# Patient Record
Sex: Male | Born: 1965 | Race: White | Hispanic: No | Marital: Single | Smoking: Light tobacco smoker
Health system: Southern US, Community
[De-identification: ages and names within clinical notes are randomized; demographics above are authoritative.]

## PROBLEM LIST (undated history)

## (undated) DIAGNOSIS — I4891 Unspecified atrial fibrillation: Secondary | ICD-10-CM

## (undated) DIAGNOSIS — I639 Cerebral infarction, unspecified: Secondary | ICD-10-CM

## (undated) DIAGNOSIS — G459 Transient cerebral ischemic attack, unspecified: Secondary | ICD-10-CM

## (undated) DIAGNOSIS — E119 Type 2 diabetes mellitus without complications: Secondary | ICD-10-CM

## (undated) DIAGNOSIS — M4807 Spinal stenosis, lumbosacral region: Secondary | ICD-10-CM

## (undated) DIAGNOSIS — I471 Supraventricular tachycardia, unspecified: Secondary | ICD-10-CM

## (undated) HISTORY — PX: UVULOPALATOPHARYNGOPLASTY: SHX827

## (undated) HISTORY — DX: Supraventricular tachycardia: I47.1

## (undated) HISTORY — PX: OTHER SURGICAL HISTORY: SHX169

## (undated) HISTORY — PX: CARDIAC CATHETERIZATION: SHX172

## (undated) HISTORY — DX: Supraventricular tachycardia, unspecified: I47.10

---

## 2014-08-31 ENCOUNTER — Other Ambulatory Visit: Payer: Self-pay

## 2014-08-31 ENCOUNTER — Emergency Department (HOSPITAL_BASED_OUTPATIENT_CLINIC_OR_DEPARTMENT_OTHER)
Admission: EM | Admit: 2014-08-31 | Discharge: 2014-09-01 | Disposition: A | Discharge status: Home / Self Care | Payer: Self-pay | Attending: Emergency Medicine | Admitting: Emergency Medicine

## 2014-08-31 ENCOUNTER — Encounter (HOSPITAL_BASED_OUTPATIENT_CLINIC_OR_DEPARTMENT_OTHER): Payer: Self-pay | Admitting: Emergency Medicine

## 2014-08-31 DIAGNOSIS — W01198A Fall on same level from slipping, tripping and stumbling with subsequent striking against other object, initial encounter: Secondary | ICD-10-CM | POA: Insufficient documentation

## 2014-08-31 DIAGNOSIS — R1912 Hyperactive bowel sounds: Secondary | ICD-10-CM | POA: Insufficient documentation

## 2014-08-31 DIAGNOSIS — Z9889 Other specified postprocedural states: Secondary | ICD-10-CM | POA: Insufficient documentation

## 2014-08-31 DIAGNOSIS — Z23 Encounter for immunization: Secondary | ICD-10-CM | POA: Insufficient documentation

## 2014-08-31 DIAGNOSIS — Y9289 Other specified places as the place of occurrence of the external cause: Secondary | ICD-10-CM | POA: Insufficient documentation

## 2014-08-31 DIAGNOSIS — S0081XA Abrasion of other part of head, initial encounter: Secondary | ICD-10-CM | POA: Insufficient documentation

## 2014-08-31 DIAGNOSIS — G43909 Migraine, unspecified, not intractable, without status migrainosus: Secondary | ICD-10-CM | POA: Insufficient documentation

## 2014-08-31 DIAGNOSIS — T148XXA Other injury of unspecified body region, initial encounter: Secondary | ICD-10-CM

## 2014-08-31 DIAGNOSIS — Z7901 Long term (current) use of anticoagulants: Secondary | ICD-10-CM | POA: Insufficient documentation

## 2014-08-31 DIAGNOSIS — Z8673 Personal history of transient ischemic attack (TIA), and cerebral infarction without residual deficits: Secondary | ICD-10-CM | POA: Insufficient documentation

## 2014-08-31 DIAGNOSIS — Z8739 Personal history of other diseases of the musculoskeletal system and connective tissue: Secondary | ICD-10-CM | POA: Insufficient documentation

## 2014-08-31 DIAGNOSIS — T148 Other injury of unspecified body region: Secondary | ICD-10-CM | POA: Insufficient documentation

## 2014-08-31 DIAGNOSIS — Z8679 Personal history of other diseases of the circulatory system: Secondary | ICD-10-CM | POA: Insufficient documentation

## 2014-08-31 DIAGNOSIS — Y998 Other external cause status: Secondary | ICD-10-CM | POA: Insufficient documentation

## 2014-08-31 DIAGNOSIS — W108XXA Fall (on) (from) other stairs and steps, initial encounter: Secondary | ICD-10-CM | POA: Insufficient documentation

## 2014-08-31 DIAGNOSIS — Z720 Tobacco use: Secondary | ICD-10-CM | POA: Insufficient documentation

## 2014-08-31 DIAGNOSIS — E1165 Type 2 diabetes mellitus with hyperglycemia: Secondary | ICD-10-CM | POA: Insufficient documentation

## 2014-08-31 DIAGNOSIS — W19XXXA Unspecified fall, initial encounter: Secondary | ICD-10-CM

## 2014-08-31 DIAGNOSIS — Y9389 Activity, other specified: Secondary | ICD-10-CM | POA: Insufficient documentation

## 2014-08-31 HISTORY — DX: Type 2 diabetes mellitus without complications: E11.9

## 2014-08-31 HISTORY — DX: Transient cerebral ischemic attack, unspecified: G45.9

## 2014-08-31 HISTORY — DX: Cerebral infarction, unspecified: I63.9

## 2014-08-31 HISTORY — DX: Spinal stenosis, lumbosacral region: M48.07

## 2014-08-31 HISTORY — DX: Unspecified atrial fibrillation: I48.91

## 2014-08-31 LAB — CBG MONITORING, ED: Glucose-Capillary: 395 mg/dL — ABNORMAL HIGH (ref 65–99)

## 2014-08-31 NOTE — ED Notes (Signed)
I placed patient on monitor. 

## 2014-08-31 NOTE — ED Notes (Addendum)
Patient states he has been having trouble with falls recently. Patient states last week he fell twice including once down a flight of stairs, states he fell again this evening striking his head, +LOC with vomiting. Patient states he has AFIB and DM, with high CBG at home. CBG 339 at home, @ 45 minutes ago, after passing out. Patient states he is off his diabetes and cardiac medications for @ 6 months due to loss of insurance. Was taking Metformin and Warforin

## 2014-09-01 ENCOUNTER — Emergency Department (HOSPITAL_BASED_OUTPATIENT_CLINIC_OR_DEPARTMENT_OTHER): Payer: Self-pay

## 2014-09-01 ENCOUNTER — Encounter (HOSPITAL_BASED_OUTPATIENT_CLINIC_OR_DEPARTMENT_OTHER): Payer: Self-pay | Admitting: Emergency Medicine

## 2014-09-01 LAB — BASIC METABOLIC PANEL
Anion gap: 8 (ref 5–15)
BUN: 16 mg/dL (ref 6–20)
CHLORIDE: 102 mmol/L (ref 101–111)
CO2: 25 mmol/L (ref 22–32)
CREATININE: 0.66 mg/dL (ref 0.61–1.24)
Calcium: 8.9 mg/dL (ref 8.9–10.3)
GFR calc Af Amer: >60 mL/min (ref >60)
GFR calc non Af Amer: >60 mL/min (ref >60)
Glucose, Bld: 293 mg/dL — ABNORMAL HIGH (ref 65–99)
Potassium: 3.8 mmol/L (ref 3.5–5.1)
Sodium: 135 mmol/L (ref 135–145)

## 2014-09-01 LAB — CBC WITH DIFFERENTIAL/PLATELET
BASOS ABS: 0 10*3/uL (ref 0.0–0.1)
Basophils Relative: 0 % (ref 0–1)
EOS PCT: 4 % (ref 0–5)
Eosinophils Absolute: 0.3 10*3/uL (ref 0.0–0.7)
HCT: 41.1 % (ref 39.0–52.0)
HEMOGLOBIN: 14.8 g/dL (ref 13.0–17.0)
LYMPHS ABS: 2.7 10*3/uL (ref 0.7–4.0)
Lymphocytes Relative: 41 % (ref 12–46)
MCH: 32 pg (ref 26.0–34.0)
MCHC: 36 g/dL (ref 30.0–36.0)
MCV: 89 fL (ref 78.0–100.0)
MONOS PCT: 9 % (ref 3–12)
Monocytes Absolute: 0.6 10*3/uL (ref 0.1–1.0)
Neutro Abs: 3 10*3/uL (ref 1.7–7.7)
Neutrophils Relative %: 46 % (ref 43–77)
PLATELETS: 139 10*3/uL — AB (ref 150–400)
RBC: 4.62 MIL/uL (ref 4.22–5.81)
RDW: 12 % (ref 11.5–15.5)
WBC: 6.5 10*3/uL (ref 4.0–10.5)

## 2014-09-01 LAB — TROPONIN I

## 2014-09-01 MED ORDER — ONDANSETRON HCL 4 MG/2ML IJ SOLN
4.0000 mg | Freq: Once | INTRAMUSCULAR | Status: AC
  Administered 2014-09-01: 4 mg via INTRAVENOUS

## 2014-09-01 MED ORDER — KETOROLAC TROMETHAMINE 30 MG/ML IJ SOLN
30.0000 mg | Freq: Once | INTRAMUSCULAR | Status: AC
  Administered 2014-09-01: 30 mg via INTRAVENOUS
  Filled 2014-09-01: qty 1

## 2014-09-01 MED ORDER — TETANUS-DIPHTH-ACELL PERTUSSIS 5-2.5-18.5 LF-MCG/0.5 IM SUSP
0.5000 mL | Freq: Once | INTRAMUSCULAR | Status: AC
  Administered 2014-09-01: 0.5 mL via INTRAMUSCULAR
  Filled 2014-09-01: qty 0.5

## 2014-09-01 MED ORDER — METFORMIN HCL 500 MG PO TABS
500.0000 mg | ORAL_TABLET | Freq: Once | ORAL | Status: AC
  Administered 2014-09-01: 500 mg via ORAL
  Filled 2014-09-01: qty 1

## 2014-09-01 MED ORDER — METFORMIN HCL 500 MG PO TABS: 500.0000 mg | ORAL_TABLET | Freq: Two times a day (BID) | ORAL | Status: DC

## 2014-09-01 MED ORDER — TRAMADOL HCL 50 MG PO TABS: 50.0000 mg | ORAL_TABLET | Freq: Four times a day (QID) | ORAL | Status: DC | PRN

## 2014-09-01 MED ORDER — TRAMADOL HCL 50 MG PO TABS
50.0000 mg | ORAL_TABLET | Freq: Once | ORAL | Status: AC
  Administered 2014-09-01: 50 mg via ORAL
  Filled 2014-09-01: qty 1

## 2014-09-01 MED ORDER — METHOCARBAMOL 500 MG PO TABS
1000.0000 mg | ORAL_TABLET | Freq: Once | ORAL | Status: AC
  Administered 2014-09-01: 1000 mg via ORAL
  Filled 2014-09-01: qty 2

## 2014-09-01 MED ORDER — ONDANSETRON HCL 4 MG/2ML IJ SOLN
INTRAMUSCULAR | Status: AC
  Filled 2014-09-01: qty 2

## 2014-09-01 NOTE — Discharge Instructions (Signed)
Blood Glucose Monitoring °Monitoring your blood glucose (also know as blood sugar) helps you to manage your diabetes. It also helps you and your health care provider monitor your diabetes and determine how well your treatment plan is working. °WHY SHOULD YOU MONITOR YOUR BLOOD GLUCOSE? °· It can help you understand how food, exercise, and medicine affect your blood glucose. °· It allows you to know what your blood glucose is at any given moment. You can quickly tell if you are having low blood glucose (hypoglycemia) or high blood glucose (hyperglycemia). °· It can help you and your health care provider know how to adjust your medicines. °· It can help you understand how to manage an illness or adjust medicine for exercise. °WHEN SHOULD YOU TEST? °Your health care provider will help you decide how often you should check your blood glucose. This may depend on the type of diabetes you have, your diabetes control, or the types of medicines you are taking. Be sure to write down all of your blood glucose readings so that this information can be reviewed with your health care provider. See below for examples of testing times that your health care provider may suggest. °Type 1 Diabetes °· Test 4 times a day if you are in good control, using an insulin pump, or perform multiple daily injections. °· If your diabetes is not well controlled or if you are sick, you may need to monitor more often. °· It is a good idea to also monitor: °· Before and after exercise. °· Between meals and 2 hours after a meal. °· Occasionally between 2:00 a.m. and 3:00 a.m. °Type 2 Diabetes °· It can vary with each person, but generally, if you are on insulin, test 4 times a day. °· If you take medicines by mouth (orally), test 2 times a day. °· If you are on a controlled diet, test once a day. °· If your diabetes is not well controlled or if you are sick, you may need to monitor more often. °HOW TO MONITOR YOUR BLOOD GLUCOSE °Supplies  Needed °· Blood glucose meter. °· Test strips for your meter. Each meter has its own strips. You must use the strips that go with your own meter. °· A pricking needle (lancet). °· A device that holds the lancet (lancing device). °· A journal or log book to write down your results. °Procedure °· Wash your hands with soap and water. Alcohol is not preferred. °· Prick the side of your finger (not the tip) with the lancet. °· Gently milk the finger until a small drop of blood appears. °· Follow the instructions that come with your meter for inserting the test strip, applying blood to the strip, and using your blood glucose meter. °Other Areas to Get Blood for Testing °Some meters allow you to use other areas of your body (other than your finger) to test your blood. These areas are called alternative sites. The most common alternative sites are: °· The forearm. °· The thigh. °· The back area of the lower leg. °· The palm of the hand. °The blood flow in these areas is slower. Therefore, the blood glucose values you get may be delayed, and the numbers are different from what you would get from your fingers. Do not use alternative sites if you think you are having hypoglycemia. Your reading will not be accurate. Always use a finger if you are having hypoglycemia. Also, if you cannot feel your lows (hypoglycemia unawareness), always use your fingers for your   blood glucose checks. ADDITIONAL TIPS FOR GLUCOSE MONITORING  Do not reuse lancets.  Always carry your supplies with you.  All blood glucose meters have a 24-hour "hotline" number to call if you have questions or need help.  Adjust (calibrate) your blood glucose meter with a control solution after finishing a few boxes of strips. BLOOD GLUCOSE RECORD KEEPING It is a good idea to keep a daily record or log of your blood glucose readings. Most glucose meters, if not all, keep your glucose records stored in the meter. Some meters come with the ability to download  your records to your home computer. Keeping a record of your blood glucose readings is especially helpful if you are wanting to look for patterns. Make notes to go along with the blood glucose readings because you might forget what happened at that exact time. Keeping good records helps you and your health care provider to work together to achieve good diabetes management.  Document Released: 03/24/2003 Document Revised: 08/05/2013 Document Reviewed: 08/13/2012 [facility] Patient Information 2015 [place], [place]. This information is not intended to replace advice given to you by your health care provider. Make sure you discuss any questions you have with your health care provider.  Contusion A contusion is the result of an injury to the skin and underlying tissues and is usually caused by direct trauma. The injury results in the appearance of a bruise on the skin overlying the injured tissues. Contusions cause rupture and bleeding of the small capillaries and blood vessels and affect function, because the bleeding infiltrates muscles, tendons, nerves, or other soft tissues.  SYMPTOMS   Swelling and often a hard lump in the injured area, either superficial or deep.  Pain and tenderness over the area of the contusion.  Feeling of firmness when pressure is exerted over the contusion.  Discoloration under the skin, beginning with redness and progressing to the characteristic "black and blue" bruise. CAUSES  A contusion is typically the result of direct trauma. This is often by a blunt object.  RISK INCREASES WITH:  Sports that have a high likelihood of trauma (football, boxing, ice hockey, soccer, field hockey, martial arts, basketball, and baseball).  Sports that make falling from a height likely (high-jumping, pole-vaulting, skating, or gymnastics).  Any bleeding disorder (hemophilia) or taking medications that affect clotting (aspirin, nonsteroidal anti-inflammatory medications, or warfarin  [Coumadin]).  Inadequate protection of exposed areas during contact sports. PREVENTION  Maintain physical fitness:  Joint and muscle flexibility.  Strength and endurance.  Coordination.  Wear proper protective equipment. Make sure it fits correctly. PROGNOSIS  Contusions typically heal without any complications. Healing time varies with the severity of injury and intake of medications that affect clotting. Contusions usually heal in 1 to 4 weeks. RELATED COMPLICATIONS   Damage to nearby nerves or blood vessels, causing numbness, coldness, or paleness.  Compartment syndrome.  Bleeding into the soft tissues that leads to disability.  Infiltrative-type bleeding, leading to the calcification and impaired function of the injured muscle (rare).  Prolonged healing time if usual activities are resumed too soon.  Infection if the skin over the injury site is broken.  Fracture of the bone underlying the contusion.  Stiffness in the joint where the injured muscle crosses. TREATMENT  Treatment initially consists of resting the injured area as well as medication and ice to reduce inflammation. The use of a compression bandage may also be helpful in minimizing inflammation. As pain diminishes and movement is tolerated, the joint where the affected  muscle crosses should be moved to prevent stiffness and the shortening (contracture) of the joint. Movement of the joint should begin as soon as possible. It is also important to work on maintaining strength within the affected muscles. Occasionally, extra padding over the area of contusion may be recommended before returning to sports, particularly if re-injury is likely.  MEDICATION   If pain relief is necessary these medications are often recommended:  Nonsteroidal anti-inflammatory medications, such as aspirin and ibuprofen.  Other minor pain relievers, such as acetaminophen, are often recommended.  Prescription pain relievers may be given  by your caregiver. Use only as directed and only as much as you need. HEAT AND COLD  Cold treatment (icing) relieves pain and reduces inflammation. Cold treatment should be applied for 10 to 15 minutes every 2 to 3 hours for inflammation and pain and immediately after any activity that aggravates your symptoms. Use ice packs or an ice massage. (To do an ice massage fill a large styrofoam cup with water and freeze. Tear a small amount of foam from the top so ice protrudes. Massage ice firmly over the injured area in a circle about the size of a softball.)  Heat treatment may be used prior to performing the stretching and strengthening activities prescribed by your caregiver, physical therapist, or athletic trainer. Use a heat pack or a warm soak. SEEK MEDICAL CARE IF:   Symptoms get worse or do not improve despite treatment in a few days.  You have difficulty moving a joint.  Any extremity becomes extremely painful, numb, pale, or cool (This is an emergency!).  Medication produces any side effects (bleeding, upset stomach, or allergic reaction).  Signs of infection (drainage from skin, headache, muscle aches, dizziness, fever, or general ill feeling) occur if skin was broken. Document Released: 03/21/2005 Document Revised: 06/13/2011 Document Reviewed: 07/03/2008 [facility] Patient Information 2015 [place], [place]. This information is not intended to replace advice given to you by your health care provider. Make sure you discuss any questions you have with your health care provider.  Emergency Department Resource Guide 1) Find a Doctor and Pay Out of Pocket Although you won't have to find out who is covered by your insurance plan, it is a good idea to ask around and get recommendations. You will then need to call the office and see if the doctor you have chosen will accept you as a new patient and what types of options they offer for patients who are self-pay. Some doctors offer discounts or will  set up payment plans for their patients who do not have insurance, but you will need to ask so you aren't surprised when you get to your appointment.  2) Contact Your Local Health Department Not all health departments have doctors that can see patients for sick visits, but many do, so it is worth a call to see if yours does. If you don't know where your local health department is, you can check in your phone book. The CDC also has a tool to help you locate your state's health department, and many state websites also have listings of all of their local health departments.  3) Find a Walk-in Clinic If your illness is not likely to be very severe or complicated, you may want to try a walk in clinic. These are popping up all over the country in pharmacies, drugstores, and shopping centers. They're usually staffed by nurse practitioners or physician assistants that have been trained to treat common illnesses and complaints. They're  usually fairly quick and inexpensive. However, if you have serious medical issues or chronic medical problems, these are probably not your best option.  No Primary Care Doctor: - Call Health Connect at  [phone] - they can help you locate a primary care doctor that  accepts your insurance, provides certain services, etc. - Physician Referral Service- [phone]  Chronic Pain Problems: Organization         Address  Phone   Notes  [facility]  [phone] Patients need to be referred by their primary care doctor.   Medication Assistance: Organization         Address  Phone   Notes  [facility] Medication [facility] [address] [place], [place] [address] [phone]) [phone] --Must be a resident of [facility] -- Must have NO insurance coverage whatsoever (no Medicaid/ Medicare, etc.) -- The pt. MUST have a primary care doctor that directs their care regularly and follows them in the community   MedAssist  [phone]) [phone]    [employer]  [phone]) [phone]    Agencies that provide inexpensive medical care: Organization         Address  Phone   Notes  [facility]  [phone]   [facility]    [phone]) [phone]   [facility] [address] [place], [place] [address] [phone]) [phone]   [facility] [address], [place] [phone]) [phone]   [facility]anned Parenthood    [phone](336) [phone]   [facility]    [phone]   [facility]  [address], [place] Phone:  [phone]) [phone], Fax:  [phone] Hours of Operation:  9 am - 6 pm, M-F.  Also accepts Medicaid/Medicare and self-pay.  [facility]  [address], [place] Phone: [phone]) [phone], Fax: [phone]. Hours of Operation:  8:30 am - 5:30 pm, M-F.  Also accepts Medicaid and self-pay.  [facility] [place] [address], [place] Phone: [phone]) [phone]   [facility] [address], [place], [place] [phone], Ext. [phone] Mondays & Thursdays: 7-9 AM.  First 15 patients are seen on a first come, first serve basis.    Medicaid-accepting [facility] Providers:  Organization         Address  Phone   Notes  [facility] [address], [place] [phone]) [phone] Also accepts self-pay patients.  [facility] [address], [place]  [phone](336) [phone]   [facility] [address], [place] [phone]) [phone]   [facility] [address] [place] [address], [place] [phone]) [phone]   Renaye RakersVeita Bland [address], [place]   [phone] Only accepts [place] Access [place] patients after they have their name applied to their card.   Self-Pay (no insurance) in [place] [facility] [place]:  Organization         Address  Phone   Notes  Sickle Cell Patients, [facility] [address],  [place] [phone]   [facility] [address], [place] [phone]) [phone]   [facility] [place]  [address], [place] [phone]   [facility]/Dr. Osei-Bonsu  [address] [place] [address], [place] or [id number], [place] [phone]  Phone number for both [employer] and [place] locations is the same.  [facility] [address], [place] [phone]   [facility] [address] [place] or [address] [phone]   [facility] [address] [place] [phone], phone; [phone], fax Sees patients 1st and 3rd Saturday of every month.  Must not qualify for public or private insurance (i.e. Medicaid, Medicare, [place] Health Choice, Veterans' Benefits)  Household income should be no more than 200% of the poverty level The clinic cannot treat you if you are pregnant or think you are pregnant  Sexually transmitted diseases are not treated at the clinic.    Dental Care: Organization         Address  Phone  Notes  [facility] [address] [place], [place] [phone] Accepts children up to age 73 who are enrolled in [place] or [place] Health Choice; pregnant women with a Medicaid card; and children who have applied for Medicaid or [place] Health Choice, but were declined, whose parents can pay a reduced fee at time of service.  [facility] Department of [facility]  [address], [place] [phone] Accepts children up to age 21 who are enrolled in [place] or [place] Health Choice; pregnant women with a Medicaid card; and children who have applied for Medicaid or [place] Health Choice, but were declined, whose parents can pay a reduced fee at time of service.  [facility]  [address] [place], [place] [phone] Patients are seen by appointment only. Walk-ins are not accepted. [facility] will see patients 63 years of age and older. Monday - Tuesday (8am-5pm) Most Wednesdays (8:30-5pm) $30 per visit, cash only  [facility] Adult Dental Access PROGRAM  [address], [facility] [phone] Patients are seen by appointment only. Walk-ins are not accepted. [facility] will see patients 23 years of age and older. One Wednesday Evening (Monthly: Volunteer Based).  $30 per visit, cash only  [employer]  [phone] for adults; Children under age 71, call Graduate Pediatric Dentistry at [phone]. Children aged 61-14, please call [phone] to request a pediatric application.  Dental services are provided in all areas of dental care including fillings, crowns and bridges, complete and partial dentures, implants, gum treatment, root canals, and extractions. Preventive care is also provided. Treatment is provided to both adults and children. Patients are selected via a lottery and there is often a waiting list.   [facility] [address] [place]  [phone] [email or web address]   [facility] [address] [place], [place] [phone], Ext. [phone] Second and Fourth Thursday of each month, opens at 6:30 AM; Clinic ends at 9 AM.  Patients are seen on a first-come first-served basis, and a limited number are seen during each clinic.   [facility]  [address] [place], [place] [phone]   Eligibility Requirements You must have lived in [place], [place], or [place] counties for at least the last three months.   You cannot be eligible for state or federal sponsored [place], including [employer], [place], or [employer].   You generally cannot be eligible for healthcare insurance through your employer.    How to apply: Eligibility screenings are held every Tuesday and Wednesday afternoon  from 1:00  pm until 4:00 pm. You do not need an appointment for the interview!  [facility] [address] [place], [place] [phone]   [facility] Health Department  [phone]   [facility] Health Department  [phone]   [facility] Health Department  [phone]    Behavioral Health Resources in the Community: Intensive Outpatient Programs Organization         Address  Phone  Notes  [facility] Services [address], [place], [place] [phone]   [facility] Outpatient [address], [place], [place] [phone]   [facility] [address] [place], [place]  [phone]   [facility] Mental Health [address]  [place], [place] [phone] or [phone]   Substance Abuse Resources Organization         Address  Phone  Notes  Alcohol and Drug Services  [phone]   [facility]  [phone]   The [place]  [phone]   Floydene Flock  [phone]   Residential & Outpatient Substance Abuse Program  [phone]   Psychological Services Organization         Address  Phone  Notes  [facility] Behavioral Health  [phone]   [facility] Services  [phone]   [facility] [address] [place] [phone] or [phone]    Mobile Crisis Teams Organization         Address  Phone  Notes  [facility], Mobile Crisis Care Unit  [phone]   [facility]  [address] [place], [place] [phone]   Doristine Locks [address] [place] [phone]    Self-Help/Support Groups Organization         Address  Phone             Notes  [facility] [place] - variety of support groups  [phone]- [phone] Call for more information  Narcotics Anonymous (NA), [facility] [address], [employer] [place]  2 meetings at this location   Nutritional therapist         Address  Phone  Notes  [facility] [address],    [place]  [phone]   [facility]  [address], [place] [address], [place], [place] [phone]   [facility] [address] [place], [place] [phone] Admissions: 8am-3pm M-F  [facility] [address],    [place], [place] [phone]   [facility] [address] [place], [place], [place] [phone]   [facility] [address],  [place], [place] [phone]   [facility] - Intensive Outpatient [address], [place], [place] [phone]   [facility] ([facility] [address]  [place], [place] [phone] or [phone]   [facility] ([facility]) [address] [place], [place] [phone] Accepts Medicaid  [facility] [place] [address],  [place] [phone] Substance Abuse/Addiction Treatment   [facility] Organization         Address  Phone  Notes  [facility]  [phone]   Angie Fava, PhD [address] Knack [place], [place]   [phone] or [phone]   [facility]al   [address] [place], [place] [phone]   [facility] [address] [place], [place] [phone] Insurance/Medicaid/sponsorship through [employer] and Families [address]  [address], [place] [phone] Therapy/tele-psych/case  [facility] [address]   [place], [place] [phone]    Dr. Lolly Mustache  [phone]   [facility]  [employer] [facility] Dept. 1) [address], [place] 2) [address] [place] 3)  [address], [place] [phone]  [phone]   [facility] Child Abuse Hotline (205)[phone] or 6308174611 (After Hours)

## 2014-09-01 NOTE — ED Provider Notes (Signed)
CSN: [id number]     Arrival date & time 08/31/14  2315 History  This chart was scribed for Laquilla Dault, MD by Ronney LionSuzanne Le, ED Scribe. This patient was seen in room MH06/MH06 and the patient's care was started at 12:26 AM.    Chief Complaint  Patient presents with  . Fall   Patient is a 49 y.o. male presenting with fall. The history is provided by the patient. No language interpreter was used.  Fall This is a recurrent problem. The current episode started 3 to 5 hours ago. The problem occurs rarely. The problem has not changed since onset.Associated symptoms include headaches. Pertinent negatives include no chest pain, no abdominal pain and no shortness of breath. Nothing aggravates the symptoms. Nothing relieves the symptoms. He has tried nothing for the symptoms. The treatment provided no relief.     HPI Comments: Timothy Deleon is a 49 y.o. male who presents to the Emergency Department complaining of falling twice over the past several weeks. He fell tonight and struck his head and lost consciousness post fall.  He checked his blood sugar, which have been high recently and was measured at 460 this morning. Patient has taken Excedrin Migraine for his headaches as he has migraine headache. He denies standing up quickly. Patient is a current smoker. He denies constipation or diarrhea, although he does note he had diarrhea 3 days ago which since resolved. No DOE no CP no SOB no leg pain or swelling.  No focal neuro deficits or changes in vision or speech.    He has not been taking DM medication lately due to insurance issues. He used to take 500 mg metformin 3 times a day, Januvia, and warfarin for A-fib. Patient is from [place] and lived in [place]. He had had a cardiac catheterization but no stents placed.   Patient just moved here 3 months ago and does not yet have a PCP.   Past Medical History  Diagnosis Date  . Diabetes mellitus without complication   . A-fib   . Stroke   . TIA (transient  ischemic attack)   . Stenosis of lumbosacral spine    Past Surgical History  Procedure Laterality Date  . Cardiac catheterization    . Cardiac cardioversion    . Uvulopalatopharyngoplasty     History reviewed. No pertinent family history. History  Substance Use Topics  . Smoking status: Current Every Day Smoker -- 0.50 packs/day    Types: Cigarettes  . Smokeless tobacco: Not on file  . Alcohol Use: Yes     Comment: social    Review of Systems  Constitutional: Negative for fever.  Respiratory: Negative for cough, shortness of breath and wheezing.   Cardiovascular: Negative for chest pain, palpitations and leg swelling.  Gastrointestinal: Negative for abdominal pain, diarrhea and constipation.  Neurological: Positive for headaches. Negative for tremors, seizures, facial asymmetry, speech difficulty, weakness and numbness.  All other systems reviewed and are negative.     Allergies  Xanax  Home Medications   Prior to Admission medications   Not on File   BP 145/97 mmHg  Pulse 92  Temp(Src) 98 F (36.7 C) (Oral)  Resp 18  Ht 6\' 4"  (1.93 m)  Wt 291 lb (131.997 kg)  BMI 35.44 kg/m2  SpO2 100% Physical Exam  Constitutional: He is oriented to person, place, and time. He appears well-developed and well-nourished. No distress.  HENT:  Head: Normocephalic. Head is without raccoon's eyes and without Battle's sign.    Right  Ear: No mastoid tenderness. No hemotympanum.  Left Ear: No mastoid tenderness. No hemotympanum.  Nose: No nasal septal hematoma.  Mouth/Throat: Oropharynx is clear and moist. No oropharyngeal exudate.  Eyes: Conjunctivae and EOM are normal. Pupils are equal, round, and reactive to light.  Neck: Normal range of motion. Neck supple. No tracheal deviation present.  Cardiovascular: Normal rate, regular rhythm, normal heart sounds and intact distal pulses.   Pulmonary/Chest: Effort normal and breath sounds normal. No respiratory distress. He has no  wheezes. He has no rales. He exhibits no tenderness.  Abdominal: Soft. He exhibits no mass. There is no tenderness. There is no rebound and no guarding.  Hyperactive bowel sounds.  Musculoskeletal: Normal range of motion. He exhibits no edema or tenderness.  Lymphadenopathy:    He has no cervical adenopathy.  Neurological: He is alert and oriented to person, place, and time. He has normal reflexes. He displays normal reflexes. No cranial nerve deficit. He exhibits normal muscle tone.  Normal gait.    Skin: Skin is warm and dry. He is not diaphoretic.  Psychiatric: He has a normal mood and affect. His behavior is normal.  Nursing note and vitals reviewed.   ED Course  Procedures (including critical care time)  DIAGNOSTIC STUDIES: Oxygen Saturation is 100% on RA, normal by my interpretation.    COORDINATION OF CARE: 12:32 AM - Discussed treatment plan with pt at bedside, and pt agreed to plan.   Labs Review Labs Reviewed  CBG MONITORING, ED - Abnormal; Notable for the following:    Glucose-Capillary 395 (*)    All other components within normal limits  CBG MONITORING, ED    Imaging Review No results found.   EKG Interpretation None      MDM   Final diagnoses:  None    EKG Interpretation  Date/Time:  Sunday Aug 31 2014 23:40:18 EDT Ventricular Rate:  58 PR Interval:  174 QRS Duration: 94 QT Interval:  398 QTC Calculation: 390 R Axis:   9 Text Interpretation:  Sinus bradycardia Confirmed by [facility]  MD, Kamdon Reisig ([id number]) on 09/01/2014 6:41:36 AM     Monitored in the ED without AFIB or PVCs or pauses.  No neuro deficits, intact cognition.  Has a h/o headaches and I suspect this headache tonight is a combination of the fall and his recurrent migraines.  No signs of CVA or lacunar disease on CT scan  No CP no SOB no DOE.  Patient stood up and then fell.  No swelling in the legs no recent log car trips or plane trips.  He is not in atrial fibrillation.  We have  restarted his metformin.  Will refer to a family doctor in the community and cardiologist for ongoing care.  Return for SOB DOE palpitations CP or any concerns.    I personally performed the services described in this documentation, which was scribed in my presence. The recorded information has been reviewed and is accurate.     Cy Blamer, MD 09/01/14 [phone]

## 2014-09-24 ENCOUNTER — Ambulatory Visit: Payer: Self-pay | Attending: Internal Medicine | Admitting: Internal Medicine

## 2014-09-24 ENCOUNTER — Encounter: Payer: Self-pay | Admitting: Internal Medicine

## 2014-09-24 VITALS — BP 123/82 | HR 67 | Temp 97.6°F | Resp 16 | Ht 76.0 in | Wt 287.0 lb

## 2014-09-24 DIAGNOSIS — Z8673 Personal history of transient ischemic attack (TIA), and cerebral infarction without residual deficits: Secondary | ICD-10-CM

## 2014-09-24 DIAGNOSIS — F17200 Nicotine dependence, unspecified, uncomplicated: Secondary | ICD-10-CM

## 2014-09-24 DIAGNOSIS — Z720 Tobacco use: Secondary | ICD-10-CM

## 2014-09-24 DIAGNOSIS — I69351 Hemiplegia and hemiparesis following cerebral infarction affecting right dominant side: Secondary | ICD-10-CM | POA: Insufficient documentation

## 2014-09-24 DIAGNOSIS — M5442 Lumbago with sciatica, left side: Secondary | ICD-10-CM

## 2014-09-24 DIAGNOSIS — F17210 Nicotine dependence, cigarettes, uncomplicated: Secondary | ICD-10-CM | POA: Insufficient documentation

## 2014-09-24 DIAGNOSIS — M4807 Spinal stenosis, lumbosacral region: Secondary | ICD-10-CM | POA: Insufficient documentation

## 2014-09-24 DIAGNOSIS — I4891 Unspecified atrial fibrillation: Secondary | ICD-10-CM | POA: Insufficient documentation

## 2014-09-24 DIAGNOSIS — E119 Type 2 diabetes mellitus without complications: Secondary | ICD-10-CM | POA: Insufficient documentation

## 2014-09-24 LAB — POCT GLYCOSYLATED HEMOGLOBIN (HGB A1C): HEMOGLOBIN A1C: 8.3

## 2014-09-24 LAB — GLUCOSE, POCT (MANUAL RESULT ENTRY): POC GLUCOSE: 151 mg/dL — AB (ref 70–99)

## 2014-09-24 MED ORDER — TRAMADOL HCL 50 MG PO TABS: 50.0000 mg | ORAL_TABLET | Freq: Two times a day (BID) | ORAL | Status: DC | PRN

## 2014-09-24 MED ORDER — RIVAROXABAN 20 MG PO TABS: 20.0000 mg | ORAL_TABLET | Freq: Every day | ORAL | Status: DC

## 2014-09-24 MED ORDER — METFORMIN HCL 1000 MG PO TABS: 1000.0000 mg | ORAL_TABLET | Freq: Two times a day (BID) | ORAL | Status: DC

## 2014-09-24 MED ORDER — METFORMIN HCL 1000 MG PO TABS: 1000.0000 mg | ORAL_TABLET | Freq: Two times a day (BID) | ORAL | Status: AC

## 2014-09-24 NOTE — Progress Notes (Signed)
  New patient here to established care Diabetes and Back pain.

## 2014-09-24 NOTE — Patient Instructions (Signed)
Come back this week, no food after midnight for fasting cholesterol check. With your history of a prior stroke we need to keep your cholesterol lowered.

## 2014-09-24 NOTE — Progress Notes (Addendum)
Patient ID: Timothy Deleon, male   DOB: 1965/08/01, 49 y.o.   MRN: [id number]  [id number]:[id number]  [id number]:[id number]  DOB - 1966-02-14  CC:  Chief Complaint  Patient presents with  . New Establshed    Diabetes        HPI: Timothy Deleon is a 49 y.o. male here today to establish medical care.  Patient has past medical history of A.Fib with a loop recorder, T2DM, and stenosis of lumbosacral spine. He reports that he recently moved here from [place] 3 months ago and has not received any care. He has been out of Metformin until his recent ER visit on 08/31/14. He states that his last Hemoglobin a1c was May 2015 which was 7.0. He checks his blood sugars daily with ranges from 105 to 179.  He was recently seen by the ER on 5/29 for dizziness which caused him to fall and hit his head due to elevated blood sugar.   He reports R sided weakness from stroke last year. Notes that he does well but sometimes when he is very tired he has more weakness on his right side. He had 3 months of rehabilitation therapy post stroke. He no longer uses a cane or walker. He reports that he was told that the stroke may have come from A.fib. He was on Warfarin at the time. He currently has a loop recorder ([employer]). His reports that he has been cardioverted twice in the past.    Lower back pain that radiates down left leg. Wants to be seen by ortho for injections.   Allergies  Allergen Reactions  . Xanax [Alprazolam] Other (See Comments)    Sleep walking   Past Medical History  Diagnosis Date  . Diabetes mellitus without complication   . A-fib   . Stroke   . TIA (transient ischemic attack)   . Stenosis of lumbosacral spine    Current Outpatient Prescriptions on File Prior to Visit  Medication Sig Dispense Refill  . metFORMIN (GLUCOPHAGE) 500 MG tablet Take 1 tablet (500 mg total) by mouth 2 (two) times daily with a meal. 60 tablet 0  . traMADol (ULTRAM) 50 MG tablet Take 1 tablet (50 mg total) by mouth every 6  (six) hours as needed. 6 tablet 0   No current facility-administered medications on file prior to visit.   No family history on file. History   Social History  . Marital Status: Single    Spouse Name: N/A  . Number of Children: N/A  . Years of Education: N/A   Occupational History  . Not on file.   Social History Main Topics  . Smoking status: Current Every Day Smoker -- 0.50 packs/day    Types: Cigarettes  . Smokeless tobacco: Not on file  . Alcohol Use: Yes     Comment: social  . Drug Use: No  . Sexual Activity: Not on file   Other Topics Concern  . Not on file   Social History Narrative    Review of Systems  Eyes: Negative for blurred vision.  Cardiovascular: Positive for palpitations (when he feels he is in A.fib). Negative for chest pain and leg swelling.  Genitourinary: Positive for frequency.  Neurological: Positive for dizziness and tingling (BLE).  Endo/Heme/Allergies: Positive for polydipsia.  All other systems reviewed and are negative.     Objective:   Filed Vitals:   09/24/14 1417  BP: 123/82  Pulse: 67  Temp: 97.6 F (36.4 C)  Resp: 16    Physical Exam  Constitutional: He is oriented to person, place, and time.  Cardiovascular: Normal rate, regular rhythm and normal heart sounds.   In NSR today  Pulmonary/Chest: Effort normal and breath sounds normal.  Feet:  Right Foot:  Skin Integrity: Negative for skin breakdown.  Left Foot:  Skin Integrity: Negative for skin breakdown.  Neurological: He is alert and oriented to person, place, and time.  Skin: Skin is warm and dry.     Lab Results  Component Value Date   WBC 6.5 09/01/2014   HGB 14.8 09/01/2014   HCT 41.1 09/01/2014   MCV 89.0 09/01/2014   PLT 139* 09/01/2014   Lab Results  Component Value Date   CREATININE 0.66 09/01/2014   BUN 16 09/01/2014   NA 135 09/01/2014   K 3.8 09/01/2014   CL 102 09/01/2014   CO2 25 09/01/2014    No results found for: HGBA1C Lipid Panel    No results found for: CHOL, TRIG, HDL, CHOLHDL, VLDL, LDLCALC     Assessment and plan:   Timothy Deleon was seen today for new establshed.  Diagnoses and all orders for this visit:  Type 2 diabetes mellitus without complication Orders: -     POCT glycosylated hemoglobin (Hb A1C) -     POCT glucose (manual entry) -     metFORMIN (GLUCOPHAGE) 1000 MG tablet; Take 1 tablet (1,000 mg total) by mouth 2 (two) times daily with a meal. -     Microalbumin, urine Will increase patient's metformin to get A1C below 8%. Dash diet, weight loss, smoking cessation addressed. Stressed strict glycemic control to lower risk of multiple complications.   Atrial fibrillation, unspecified Orders: -     rivaroxaban (XARELTO) 20 MG TABS tablet; Take 1 tablet (20 mg total) by mouth daily with supper. -     Lipid panel; Future Will start patient on Xarelto due to reported history. He is very high risk for repeat stroke without coagulation. Patient's last EKG was sinus brady cardia, no a.fib. I have asked patient to sign release of medical records from prior cardiologist for Dr. Daleen Squibb to review next week. CHAD2 score of 3.  If patient falls he will immediately report to the ER for evaluation   History of CVA See above. I will check a lipid panel on patient and place him on statin therapy when result return. Explained the importance of cholesterol control and risk factor modifications. Smoking cessation addressed.  Left-sided low back pain with left-sided sciatica Orders: -     traMADol (ULTRAM) 50 MG tablet; Take 1 tablet (50 mg total) by mouth every 12 (twelve) hours as needed. Once patient receives hospital discount I will refer him to Ortho.   Smoker Smoking cessation discussed for 3 minutes, patient is not willing to quit at this time. Will continue to assess on each visit. Discussed increased risk for diseases such as cancer, heart disease, and stroke.    Return for this week lab visit and Wednesday Dr. Daleen Squibb  .  The patient was given clear instructions to go to ER or return to medical center if symptoms don't improve, worsen or new problems develop. The patient verbalized understanding. The patient was told to call to get lab results if they haven't heard anything in the next week.     Holland Commons, NP-C [facility] [phone] 09/24/2014, 2:18 PM

## 2014-09-25 ENCOUNTER — Ambulatory Visit: Payer: Self-pay | Attending: Internal Medicine

## 2014-09-25 DIAGNOSIS — I4891 Unspecified atrial fibrillation: Secondary | ICD-10-CM

## 2014-09-25 LAB — LIPID PANEL
CHOL/HDL RATIO: 5.4 ratio
Cholesterol: 156 mg/dL (ref 0–200)
HDL: 29 mg/dL — AB (ref >=40)
LDL CALC: 96 mg/dL (ref 0–99)
Triglycerides: 153 mg/dL — ABNORMAL HIGH (ref <150)
VLDL: 31 mg/dL (ref 0–40)

## 2014-09-25 LAB — MICROALBUMIN, URINE: Microalb, Ur: <0.2 mg/dL (ref <2.0)

## 2014-09-30 ENCOUNTER — Other Ambulatory Visit: Payer: Self-pay | Admitting: Internal Medicine

## 2014-10-01 ENCOUNTER — Other Ambulatory Visit: Payer: Self-pay

## 2014-10-01 ENCOUNTER — Ambulatory Visit (HOSPITAL_BASED_OUTPATIENT_CLINIC_OR_DEPARTMENT_OTHER): Payer: Self-pay | Admitting: Cardiology

## 2014-10-01 ENCOUNTER — Encounter: Payer: Self-pay | Admitting: Cardiology

## 2014-10-01 ENCOUNTER — Ambulatory Visit: Payer: Self-pay

## 2014-10-01 ENCOUNTER — Ambulatory Visit: Payer: Self-pay | Attending: Internal Medicine

## 2014-10-01 VITALS — BP 123/82 | HR 70 | Temp 97.8°F | Resp 18 | Ht 76.0 in | Wt 283.6 lb

## 2014-10-01 DIAGNOSIS — E119 Type 2 diabetes mellitus without complications: Secondary | ICD-10-CM | POA: Insufficient documentation

## 2014-10-01 DIAGNOSIS — I4891 Unspecified atrial fibrillation: Secondary | ICD-10-CM

## 2014-10-01 DIAGNOSIS — Z7901 Long term (current) use of anticoagulants: Secondary | ICD-10-CM | POA: Insufficient documentation

## 2014-10-01 DIAGNOSIS — I480 Paroxysmal atrial fibrillation: Secondary | ICD-10-CM

## 2014-10-01 DIAGNOSIS — F17210 Nicotine dependence, cigarettes, uncomplicated: Secondary | ICD-10-CM | POA: Insufficient documentation

## 2014-10-01 DIAGNOSIS — Z79899 Other long term (current) drug therapy: Secondary | ICD-10-CM | POA: Insufficient documentation

## 2014-10-01 DIAGNOSIS — Z8673 Personal history of transient ischemic attack (TIA), and cerebral infarction without residual deficits: Secondary | ICD-10-CM | POA: Insufficient documentation

## 2014-10-01 MED ORDER — ATORVASTATIN CALCIUM 20 MG PO TABS: 20.0000 mg | ORAL_TABLET | Freq: Every day | ORAL | Status: AC

## 2014-10-01 MED ORDER — DILTIAZEM HCL 60 MG PO TABS: 60.0000 mg | ORAL_TABLET | ORAL | Status: AC | PRN

## 2014-10-01 NOTE — Patient Instructions (Addendum)
Thank you for coming in today. Diltiazem has been sent to the pharmacy for you to pick up. Please go see Dr. Ladona Ridgelaylor on Tuesday, October 07, 2014 at 4pm. Arrive 15min early to check in.

## 2014-10-01 NOTE — Progress Notes (Signed)
Patient here to see Dr. Daleen SquibbWall per request of Holland CommonsValerie Keck, NP.  Past history of stroke (05/2013), A-fib, SVT, bradycardia, and inplanted loop monitor over 1 year ago, not being monitored currently- wants to know if he still needs it. Patient reports asymptomatic a-fib occurances 2-3x/week that do not last more than 1 hour at a time.  Patient states he is "really tired" after he goes back into normal sinus rhythm and laying down helps. Patient complains of constant lower/upper back pain from stenosis.  Patient reports sharp pains in chest that "may be from upper back pain, but difficult to distinguish.". Patient states he checks his heart rate and rhythm to make sure it is normal and knows when to go to the hospital. Patient has had two cardioversions in the past (01/2013 and 02/2013). Patient reports headaches and weakness on right side due to stroke. With headaches, patient reports "dizzy spells" and has fallen and injured his head with the dizziness.   Patient prescribed Lipitor and Tramadol, but has not taken them today. Patient took Xarelto and Metformin today.  BP 123/82, HR 70.

## 2014-10-02 ENCOUNTER — Telehealth: Payer: Self-pay | Admitting: Internal Medicine

## 2014-10-02 NOTE — Telephone Encounter (Signed)
Patient wanted to inform PCP that he has [facility] and is ready to be referred; please f/u with patient about this request

## 2014-10-02 NOTE — Telephone Encounter (Signed)
See below

## 2014-10-03 ENCOUNTER — Other Ambulatory Visit: Payer: Self-pay | Admitting: Internal Medicine

## 2014-10-03 DIAGNOSIS — M5442 Lumbago with sciatica, left side: Secondary | ICD-10-CM

## 2014-10-03 NOTE — Telephone Encounter (Signed)
Referral has been placed. 

## 2014-10-07 ENCOUNTER — Institutional Professional Consult (permissible substitution): Payer: Self-pay | Admitting: Internal Medicine

## 2014-10-07 DIAGNOSIS — I480 Paroxysmal atrial fibrillation: Secondary | ICD-10-CM | POA: Insufficient documentation

## 2014-10-07 NOTE — Progress Notes (Signed)
HPI [name] is referred today for the evaluation and treatment of paroxysmal atrial fibrillation.  He has occasional episodes of atrial fibrillation which last for brief periods of time 2-3 times per week. Sometimes he gets a little lightheaded but lays down and that helps. He has no chest pain, no syncope. He has no history of any organic heart disease.  He has an implanted loop monitor that he would like to have removed.  Holland CommonsValerie Keck placed him on anticoagulation because of a history of stroke and diabetes.  Past Medical History  Diagnosis Date  . Diabetes mellitus without complication   . A-fib   . Stroke   . TIA (transient ischemic attack)   . Stenosis of lumbosacral spine   . SVT (supraventricular tachycardia)     Current Outpatient Prescriptions  Medication Sig Dispense Refill  . metFORMIN (GLUCOPHAGE) 1000 MG tablet Take 1 tablet (1,000 mg total) by mouth 2 (two) times daily with a meal. 60 tablet 5  . rivaroxaban (XARELTO) 20 MG TABS tablet Take 1 tablet (20 mg total) by mouth daily with supper. 30 tablet 5  . atorvastatin (LIPITOR) 20 MG tablet Take 1 tablet (20 mg total) by mouth daily. 30 tablet 2  . diltiazem (CARDIZEM) 60 MG tablet Take 1 tablet (60 mg total) by mouth every 4 (four) hours as needed (for fast a-fib.). 30 tablet 11  . traMADol (ULTRAM) 50 MG tablet Take 1 tablet (50 mg total) by mouth every 12 (twelve) hours as needed. 30 tablet 0   No current facility-administered medications for this visit.    Allergies  Allergen Reactions  . Xanax [Alprazolam] Other (See Comments)    Sleep walking    Family History  Problem Relation Age of Onset  . Heart disease Mother   . Peripheral Artery Disease Mother   . Heart disease Father   . Heart attack    . Hypertension Brother   . Heart disease Maternal Grandmother   . Diabetes Maternal Grandfather   . Alzheimer's disease Maternal Grandfather     History   Social History  . Marital Status: Single   Spouse Name: N/A  . Number of Children: N/A  . Years of Education: N/A   Occupational History  . Not on file.   Social History Main Topics  . Smoking status: Current Every Day Smoker -- 0.50 packs/day    Types: Cigarettes  . Smokeless tobacco: Not on file  . Alcohol Use: Yes     Comment: social  . Drug Use: No  . Sexual Activity: Not on file   Other Topics Concern  . Not on file   Social History Narrative    ROS ALL NEGATIVE EXCEPT THOSE NOTED IN HPI  PE  General Appearance: well developed, well nourished in no acute distress HEENT: symmetrical face, PERRLA, good dentition  Neck: no JVD, thyromegaly, or adenopathy, trachea midline Chest: symmetric without deformity Cardiac: PMI non-displaced, RRR, normal S1, S2, no gallop or murmur Lung: clear to ausculation and percussion Vascular: all pulses full without bruits  Abdominal: nondistended, nontender, good bowel sounds, no HSM, no bruits Extremities: no cyanosis, clubbing or edema, no sign of DVT, no varicosities  Skin: normal color, no rashes Neuro: alert and oriented x 3, non-focal Pysch: normal affect  EKG  BMET    Component Value Date/Time   NA 135 09/01/2014 0225   K 3.8 09/01/2014 0225   CL 102 09/01/2014 0225   CO2 25 09/01/2014 0225   GLUCOSE 293* 09/01/2014 0225  BUN 16 09/01/2014 0225   CREATININE 0.66 09/01/2014 0225   CALCIUM 8.9 09/01/2014 0225   GFRNONAA >60 09/01/2014 0225   GFRAA >60 09/01/2014 0225    Lipid Panel     Component Value Date/Time   CHOL 156 09/25/2014 0915   TRIG 153* 09/25/2014 0915   HDL 29* 09/25/2014 0915   CHOLHDL 5.4 09/25/2014 0915   VLDL 31 09/25/2014 0915   LDLCALC 96 09/25/2014 0915    CBC    Component Value Date/Time   WBC 6.5 09/01/2014 0225   RBC 4.62 09/01/2014 0225   HGB 14.8 09/01/2014 0225   HCT 41.1 09/01/2014 0225   PLT 139* 09/01/2014 0225   MCV 89.0 09/01/2014 0225   MCH 32.0 09/01/2014 0225   MCHC 36.0 09/01/2014 0225   RDW 12.0 09/01/2014  0225   LYMPHSABS 2.7 09/01/2014 0225   MONOABS 0.6 09/01/2014 0225   EOSABS 0.3 09/01/2014 0225   BASOSABS 0.0 09/01/2014 0225

## 2014-10-07 NOTE — Assessment & Plan Note (Signed)
All continue with his current medications including his anticoagulation. I've also given him diltiazem 60 mg tablets to use when necessary every 4 hours for recalcitrant atrial fib. I'll see him back in a year.

## 2014-10-17 ENCOUNTER — Other Ambulatory Visit: Payer: Self-pay | Admitting: Internal Medicine

## 2014-10-17 ENCOUNTER — Institutional Professional Consult (permissible substitution): Payer: Self-pay | Admitting: Internal Medicine

## 2014-10-17 DIAGNOSIS — I4891 Unspecified atrial fibrillation: Secondary | ICD-10-CM

## 2014-10-17 MED ORDER — RIVAROXABAN 20 MG PO TABS: 20.0000 mg | ORAL_TABLET | Freq: Every day | ORAL | Status: AC

## 2014-11-06 ENCOUNTER — Encounter: Payer: Self-pay | Admitting: Internal Medicine

## 2014-11-06 ENCOUNTER — Ambulatory Visit (INDEPENDENT_AMBULATORY_CARE_PROVIDER_SITE_OTHER): Payer: No Typology Code available for payment source | Admitting: Internal Medicine

## 2014-11-06 VITALS — BP 120/82 | HR 81 | Ht 76.0 in | Wt 280.2 lb

## 2014-11-06 DIAGNOSIS — I480 Paroxysmal atrial fibrillation: Secondary | ICD-10-CM

## 2014-11-06 DIAGNOSIS — I10 Essential (primary) hypertension: Secondary | ICD-10-CM

## 2014-11-06 DIAGNOSIS — R55 Syncope and collapse: Secondary | ICD-10-CM

## 2014-11-06 NOTE — Patient Instructions (Addendum)

## 2014-11-07 DIAGNOSIS — I10 Essential (primary) hypertension: Secondary | ICD-10-CM | POA: Insufficient documentation

## 2014-11-07 DIAGNOSIS — R55 Syncope and collapse: Secondary | ICD-10-CM | POA: Insufficient documentation

## 2014-11-07 NOTE — Assessment & Plan Note (Signed)
His blood pressure is well controlled. He will continue his current meds. 

## 2014-11-07 NOTE — Assessment & Plan Note (Signed)
Still no obvious etiology. He will undergo watchful waiting.

## 2014-11-07 NOTE — Assessment & Plan Note (Signed)
He will continue a strategy of rate control and systemic anti-coagulation. Will follow.

## 2014-11-07 NOTE — Progress Notes (Signed)
HPI Timothy Deleon is referred today by Dr. Daleen Squibb for ongoing evaluation of his ILR. He is a 49 yo man with atrial fib and a stroke who has moved to the area several months ago. He also has a h/o syncope. When I initially saw him he was interested in having his ILR removed. However on additional reflection has changed his mind. The patient has palpitations and dizzy spells with his atrial fibrillation. I suspect he has post termination pauses from atrial fib as the indication for having the device placed but we have no evidence of this on interogation.  Allergies  Allergen Reactions  . Xanax [Alprazolam] Other (See Comments)    Sleep walking     Current Outpatient Prescriptions  Medication Sig Dispense Refill  . atorvastatin (LIPITOR) 20 MG tablet Take 1 tablet (20 mg total) by mouth daily. 30 tablet 2  . diltiazem (CARDIZEM) 60 MG tablet Take 1 tablet (60 mg total) by mouth every 4 (four) hours as needed (for fast a-fib.). 30 tablet 11  . metFORMIN (GLUCOPHAGE) 1000 MG tablet Take 1 tablet (1,000 mg total) by mouth 2 (two) times daily with a meal. 60 tablet 5  . nortriptyline (PAMELOR) 25 MG capsule Take 25 mg by mouth daily.    . rivaroxaban (XARELTO) 20 MG TABS tablet Take 1 tablet (20 mg total) by mouth daily with supper. 90 tablet 3  . traMADol (ULTRAM) 50 MG tablet Take 50 mg by mouth every 12 (twelve) hours as needed (pain).     No current facility-administered medications for this visit.     Past Medical History  Diagnosis Date  . Diabetes mellitus without complication   . A-fib   . Stroke   . TIA (transient ischemic attack)   . Stenosis of lumbosacral spine   . SVT (supraventricular tachycardia)     ROS:   All systems reviewed and negative except as noted in the HPI.   Past Surgical History  Procedure Laterality Date  . Cardiac catheterization    . Cardiac cardioversion    . Uvulopalatopharyngoplasty       Family History  Problem Relation Age of Onset  .  Heart disease Mother   . Peripheral Artery Disease Mother   . Heart disease Father   . Heart attack    . Hypertension Brother   . Heart disease Maternal Grandmother   . Diabetes Maternal Grandfather   . Alzheimer's disease Maternal Grandfather      History   Social History  . Marital Status: Single    Spouse Name: N/A  . Number of Children: N/A  . Years of Education: N/A   Occupational History  . Not on file.   Social History Main Topics  . Smoking status: Light Tobacco Smoker -- 0.50 packs/day    Types: Cigarettes  . Smokeless tobacco: Not on file  . Alcohol Use: 0.0 oz/week    0 Standard drinks or equivalent per week     Comment: social  . Drug Use: No  . Sexual Activity: Not on file   Other Topics Concern  . Not on file   Social History Narrative     BP 120/82 mmHg  Pulse 81  Ht 6\' 4"  (1.93 m)  Wt 280 lb 3.2 oz (127.098 kg)  BMI 34.12 kg/m2  Physical Exam:  stable appearing middle aged man, NAD HEENT: Unremarkable Neck:  6 cm JVD, no thyromegally Back:  No CVA tenderness Lungs:  Clear with no wheezes HEART:  Regular rate rhythm, no murmurs, no rubs, no clicks Abd:  soft, positive bowel sounds, no organomegally, no rebound, no guarding Ext:  2 plus pulses, no edema, no cyanosis, no clubbing Skin:  No rashes no nodules Neuro:  CN II through XII intact, motor grossly intact  EKG  DEVICE  Normal device function.  See PaceArt for details.   Assess/Plan:

## 2014-11-12 ENCOUNTER — Telehealth: Payer: Self-pay | Admitting: *Deleted

## 2014-11-12 NOTE — Telephone Encounter (Signed)
Called patient to confirm that his [employer] monitoring has been successfully transferred to our clinic and that he has a Carelink monitor. Patient voices that he has his Carelink monitor and that he will set it up tonight and will attempt to send a transmission tomorrow.  If remote is not received tomorrow, will walk through manual transmission to re-establish monitoring.  Patient voices agreement of plan.

## 2014-12-16 ENCOUNTER — Encounter (HOSPITAL_COMMUNITY): Payer: Self-pay

## 2014-12-16 ENCOUNTER — Ambulatory Visit (HOSPITAL_COMMUNITY): Admit: 2014-12-16 | Payer: Self-pay | Admitting: Internal Medicine

## 2014-12-16 SURGERY — LOOP RECORDER REMOVAL

## 2015-01-06 ENCOUNTER — Telehealth: Payer: Self-pay | Admitting: *Deleted

## 2015-01-06 NOTE — Telephone Encounter (Signed)
Able to reach patient at updated phone number.  Patient states that his father recently had a heart attack and that he moved back to [place] to be with him for "awhile".  He states he is still interested in remote monitoring of his LINQ, but that he "couldn't get the monitor to link to the device".  Gave patient [employer] tech services' phone number and explained that as soon as he is able, he should call tech services to try to rectify the connection issue so that we can resume monitoring his device.  Also instructed him to call us with his monitor information (serial number) so that we can link it in our system.  Patient verbalizes understanding of all instructions and states that he will call when he is able.

## 2015-01-06 NOTE — Telephone Encounter (Signed)
Attempted to reach patient at phone number listed in chart.  Phone number out of service.  Called EC, Jovon Fox, to see if patient had updated phone number.  Mr. Caryn Section reports that Mr. Vallee moved back to [place] but he was able to provide an updated phone number.  Will attempt to reach patient at [phone].

## 2015-05-22 ENCOUNTER — Encounter: Payer: Self-pay | Admitting: Cardiology

## 2015-05-29 ENCOUNTER — Encounter: Payer: Self-pay | Admitting: Cardiology

## 2015-06-19 ENCOUNTER — Telehealth: Payer: Self-pay | Admitting: Cardiology

## 2015-06-19 NOTE — Telephone Encounter (Signed)
Attempted to call pt about home monitor for loop recorder not transmitting. Number has been disconnected.

## 2015-06-30 ENCOUNTER — Encounter: Payer: Self-pay | Admitting: Cardiology

## 2015-07-14 ENCOUNTER — Encounter: Payer: Self-pay | Admitting: Cardiology

## 2015-11-04 ENCOUNTER — Telehealth: Payer: Self-pay | Admitting: Cardiology

## 2015-11-04 NOTE — Telephone Encounter (Signed)
Certified letter mailed and returned unsigned. Un-enrolled in [employer] and return kit not ordered. Marked inactive in [employer].

## 2016-04-14 IMAGING — DX DG CHEST 2V
2 series · 2 of 2 positions shown · non-contrast
Comparison: None.

CLINICAL DATA: Fall twice over the past several weeks. Headache and
dizziness. Mid chest pain.

EXAM:
CHEST  2 VIEW

[chest pa]
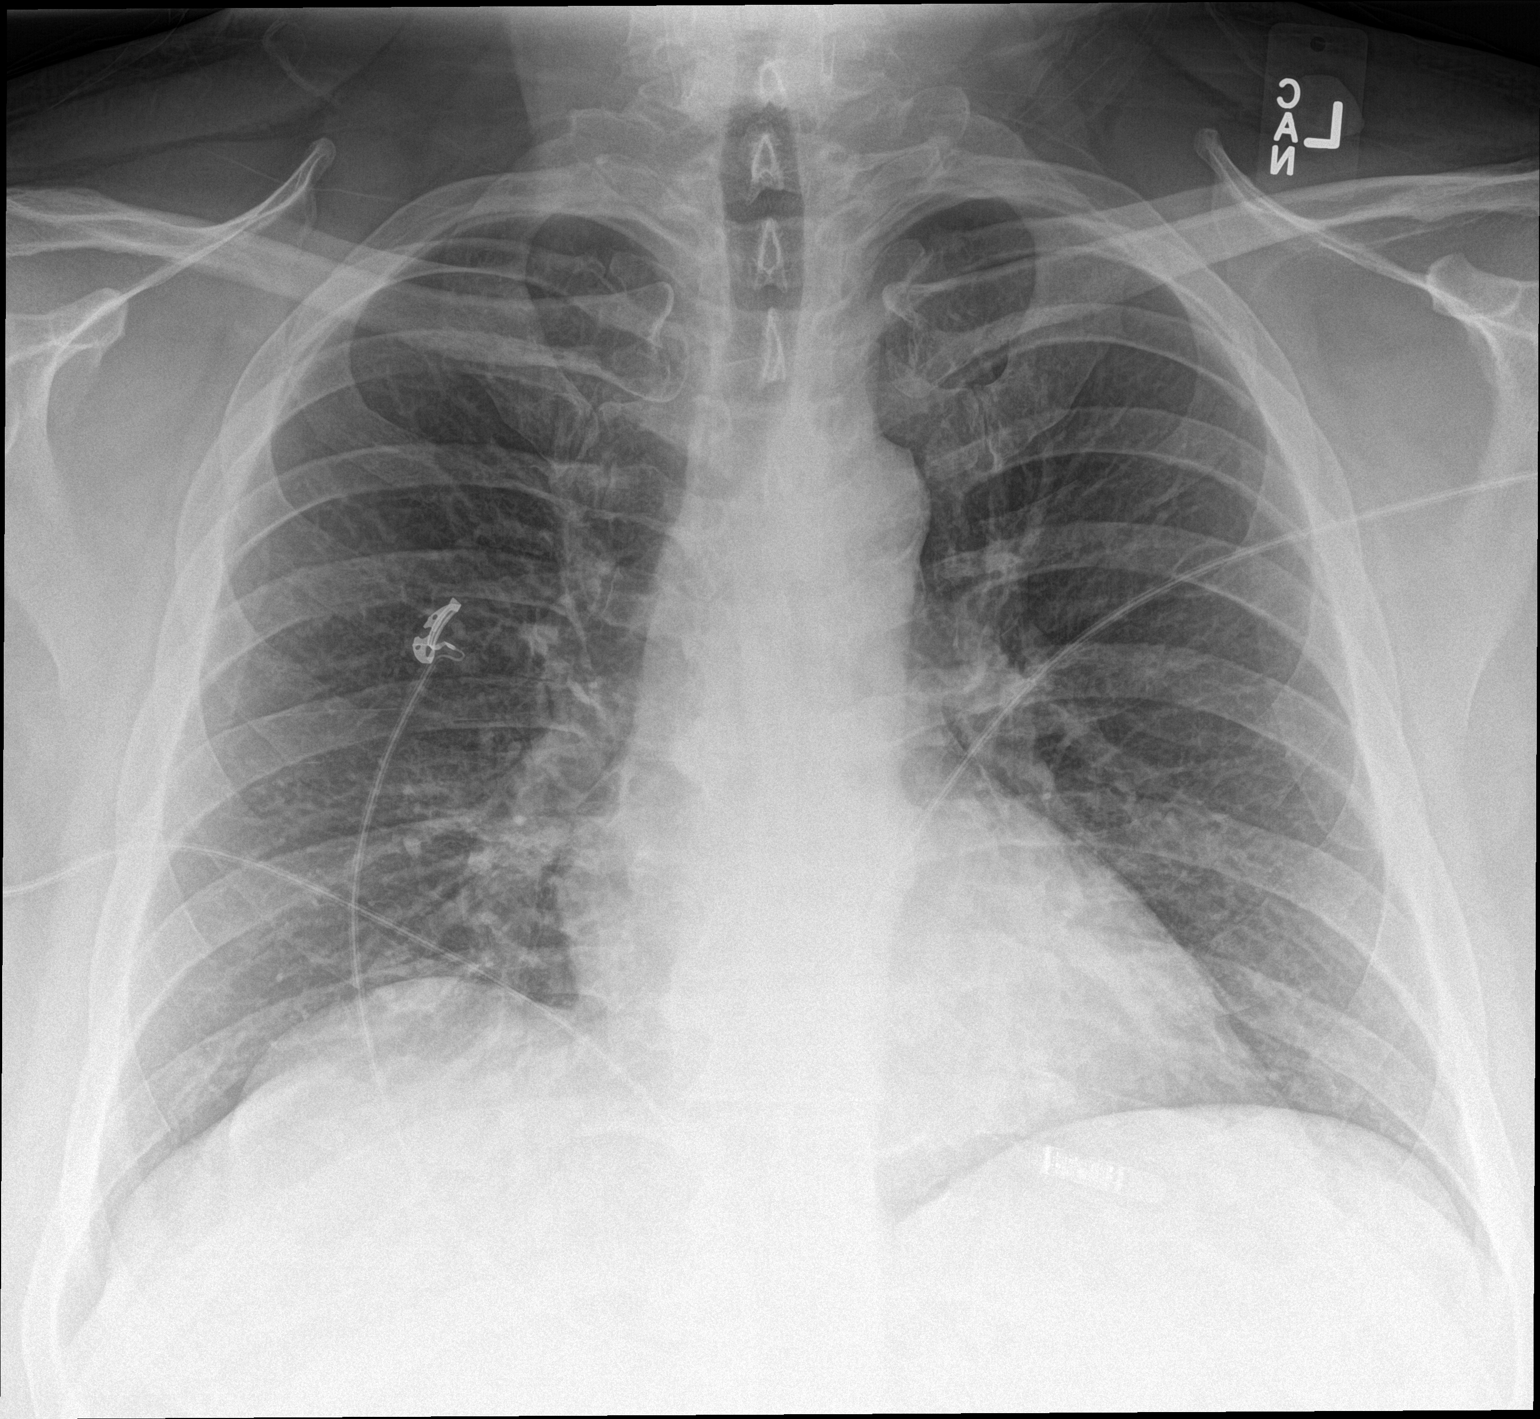

[chest lat]
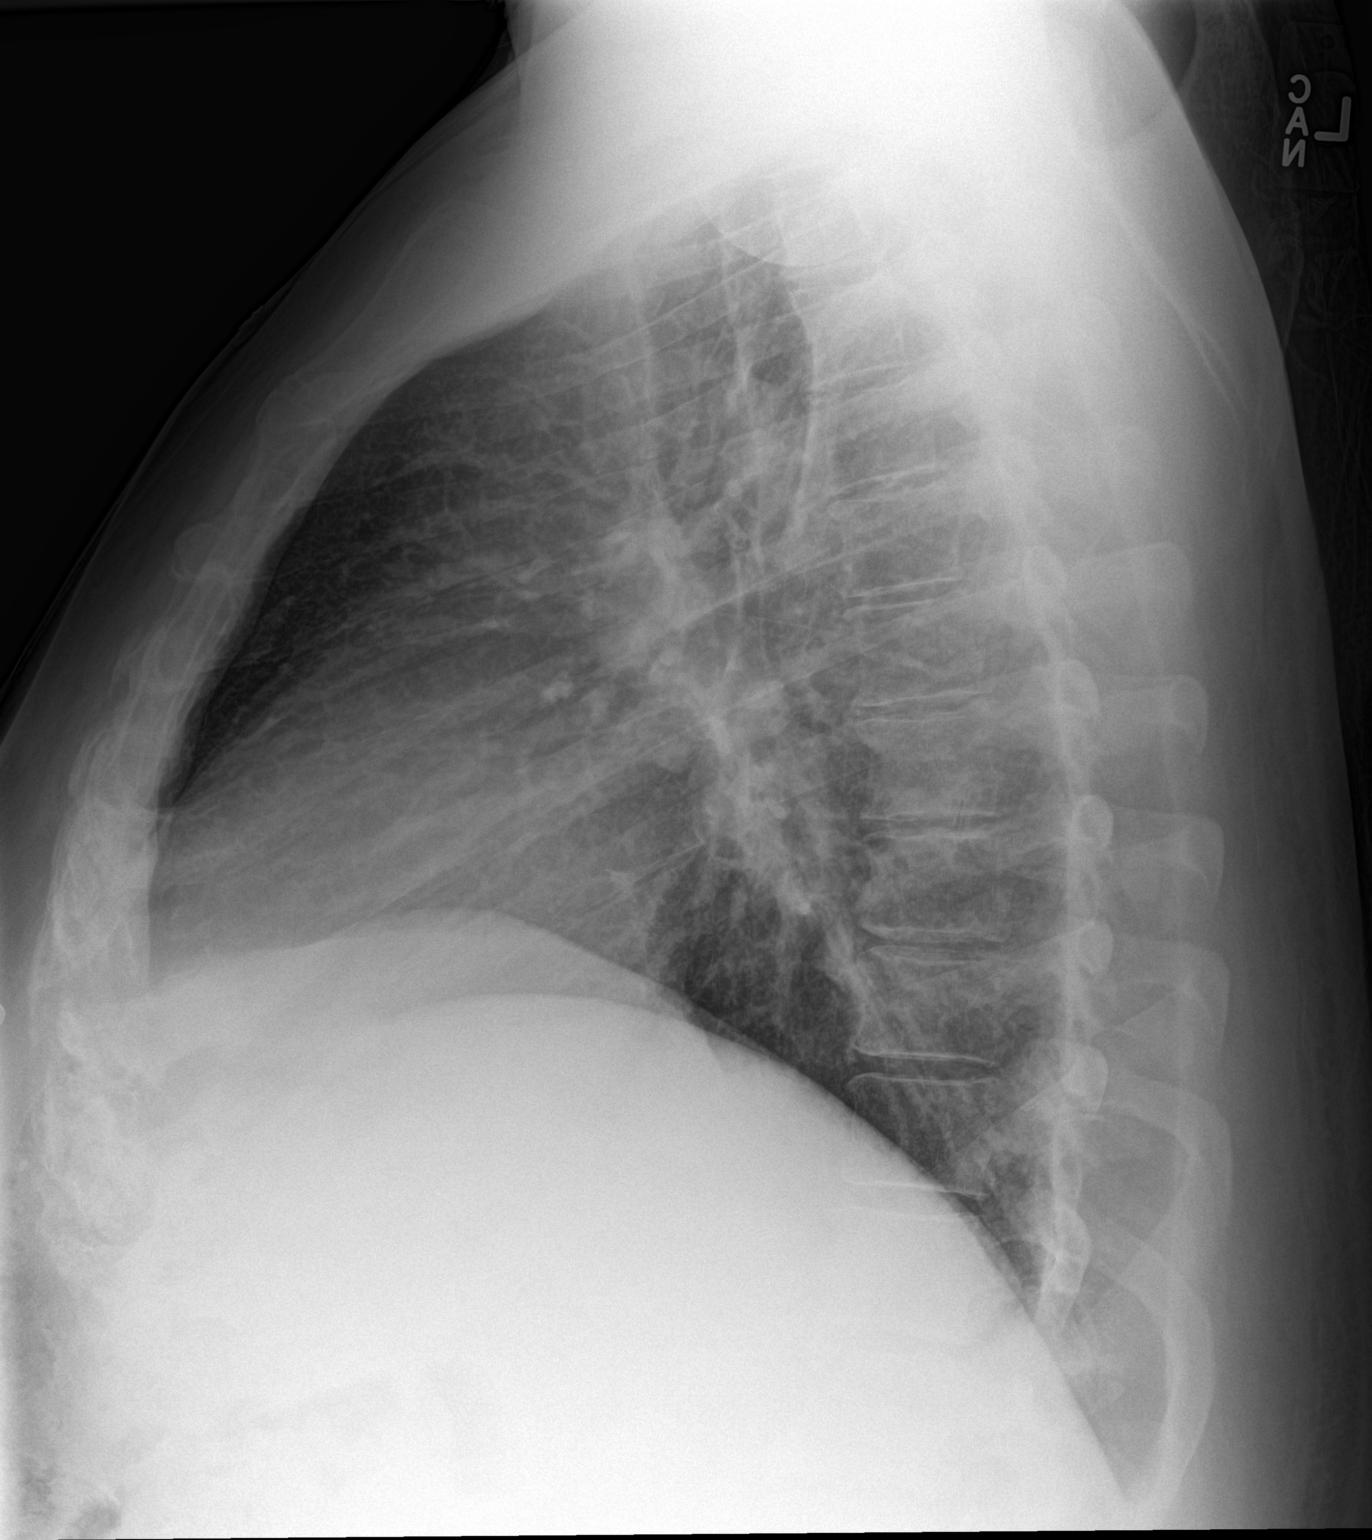

[2 of 2 positions shown; findings below may reference images not displayed]

FINDINGS: Heart is at the upper limits of normal in size, mediastinal contours
are normal. Probable loop recording device projects over the left
chest wall. Pulmonary vasculature is normal. No consolidation,
pleural effusion, or pneumothorax. No acute osseous abnormalities
are seen. There is mild degenerative disc disease in the mid
thoracic spine.
IMPRESSION: No acute pulmonary process.

## 2016-04-14 IMAGING — CT CT HEAD W/O CM
1 series · 16 of 30 positions shown, 20 images · non-contrast
Comparison: None.

CLINICAL DATA: Status post two recent falls. Hit forehead on door
handle. Headache and dizziness. Hyperglycemia. Initial encounter.

EXAM:
CT HEAD WITHOUT CONTRAST
TECHNIQUE: Contiguous axial images were obtained from the base of the skull
through the vertex without intravenous contrast.

[Series 2: head 4.8 h37s · axial · 0.49mm/px · z∈[+921,+1054]mm · 16 of 32 slices shown, 20 images]
[im 2/32  brain]
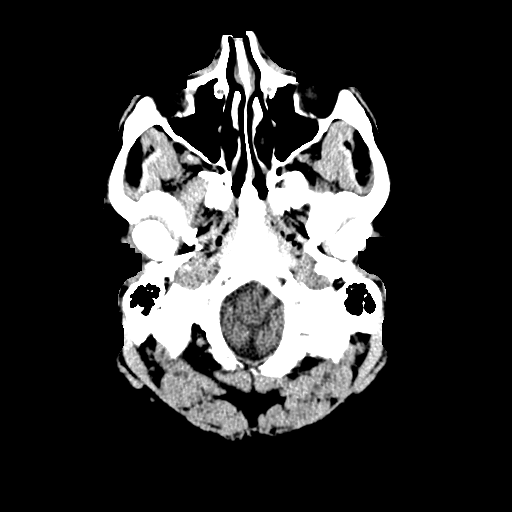
[im 2/32  bone]
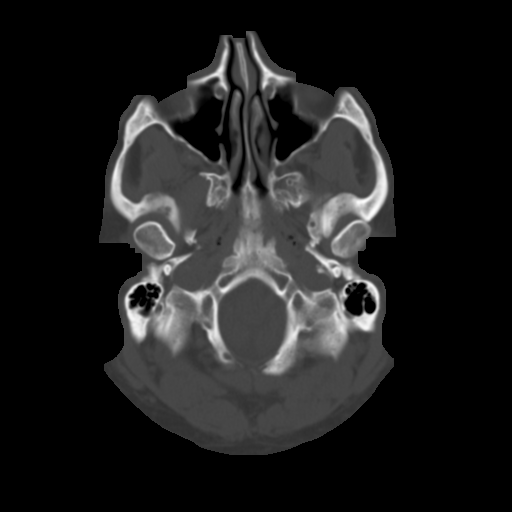
[im 4/32  brain]
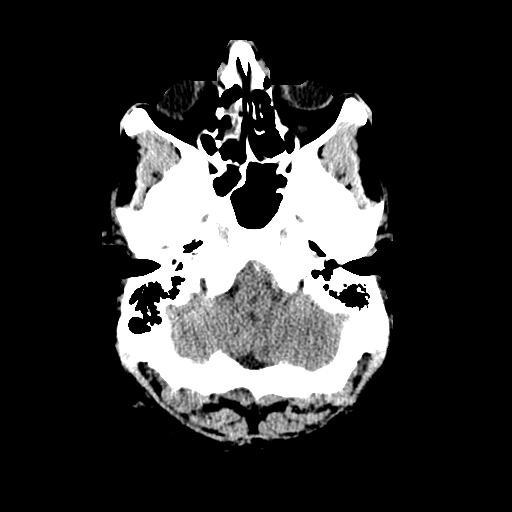
[im 6/32  brain]
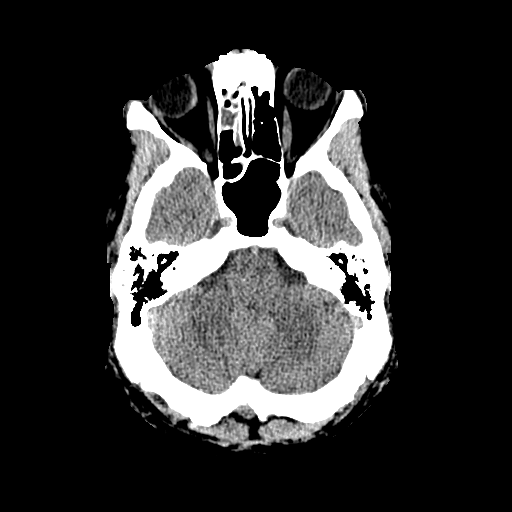
[im 8/32  brain]
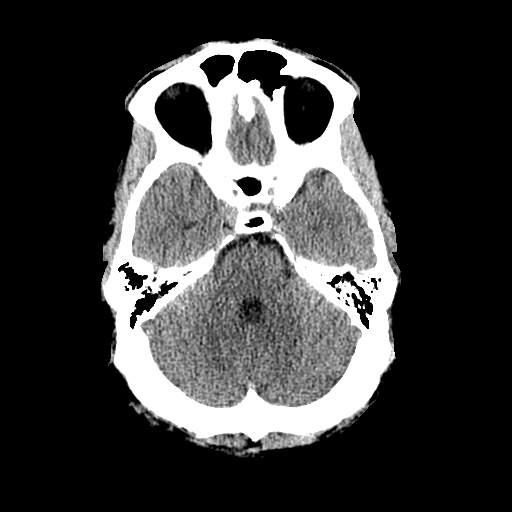
[im 9/32  brain]
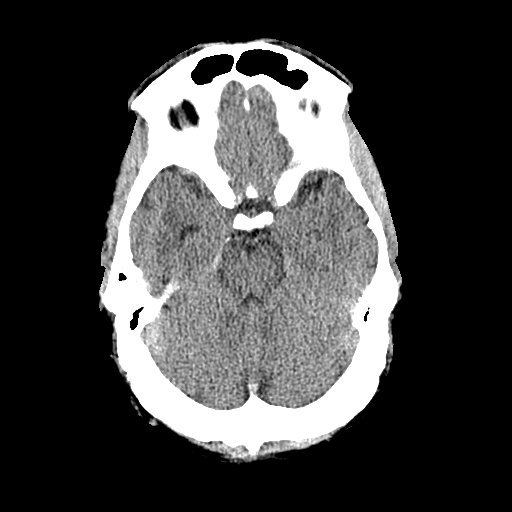
[im 9/32  bone]
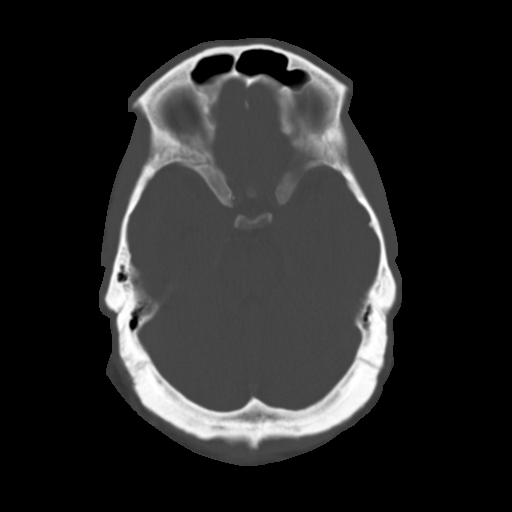
[im 11/32  brain]
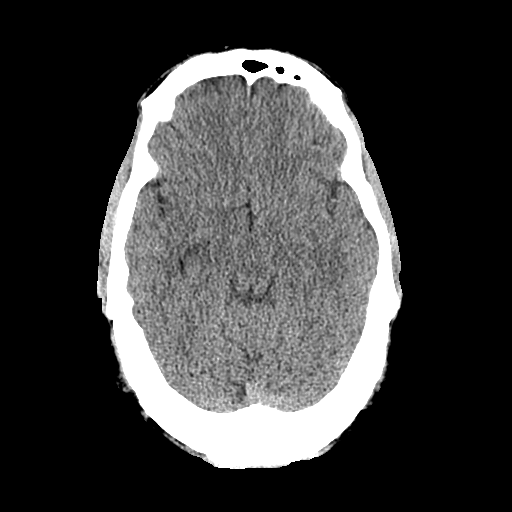
[im 13/32  brain]
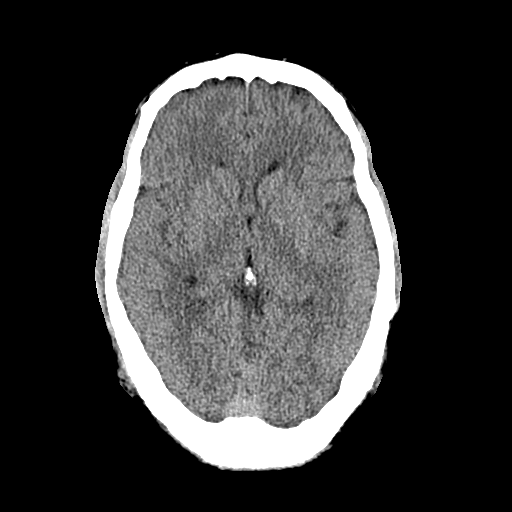
[im 15/32  brain]
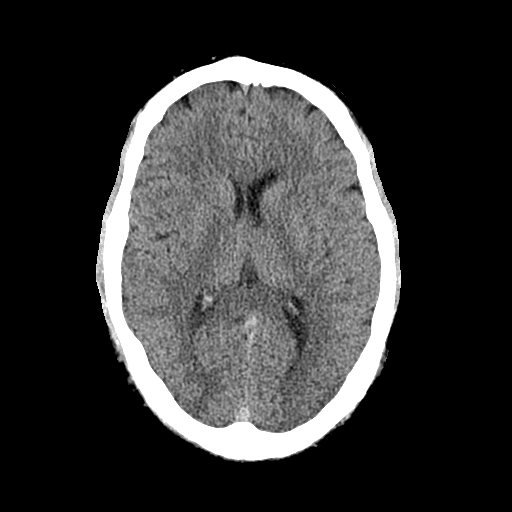
[im 17/32  brain]
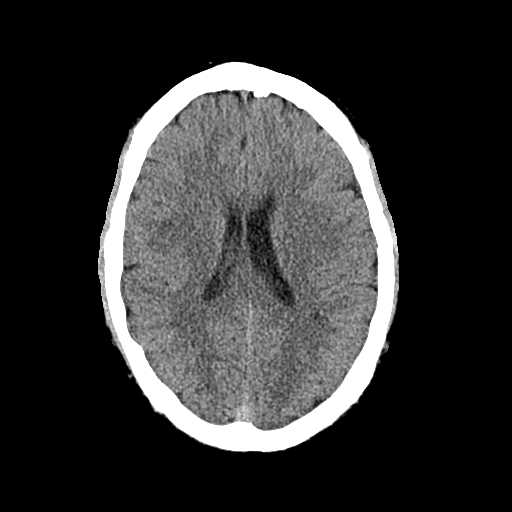
[im 17/32  bone]
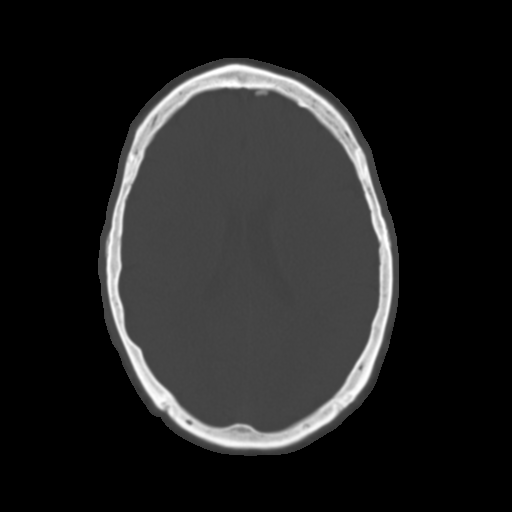
[im 19/32  brain]
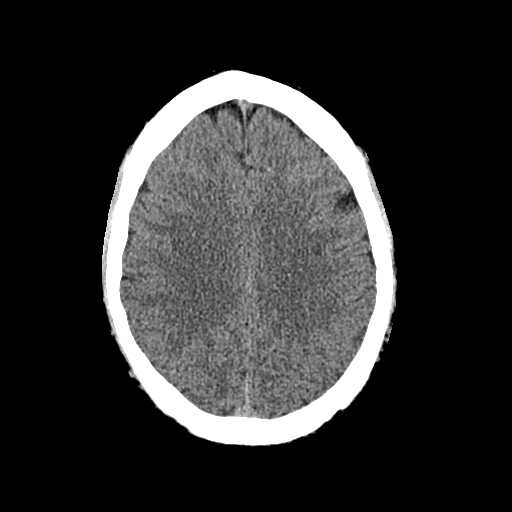
[im 21/32  brain]
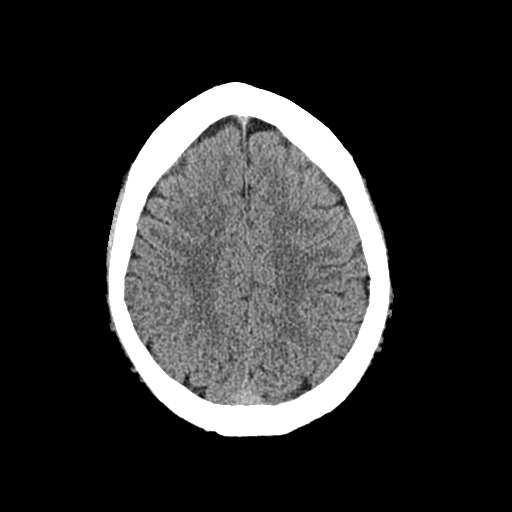
[im 23/32  brain]
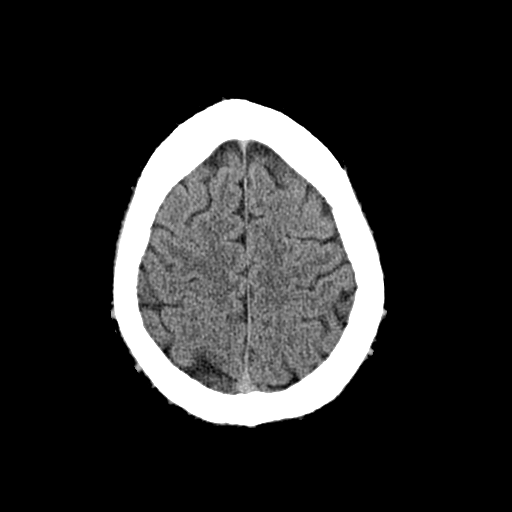
[im 24/32  brain]
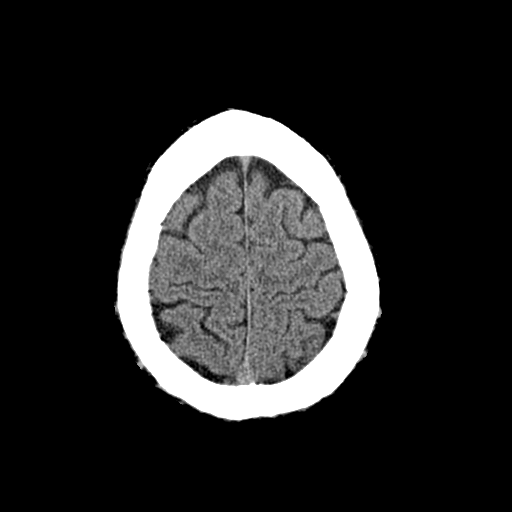
[im 24/32  bone]
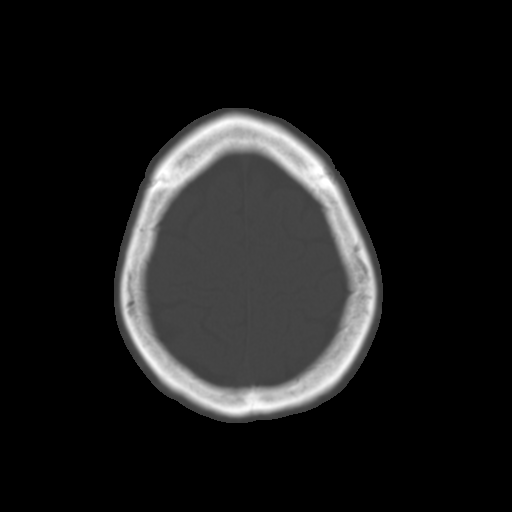
[im 26/32  brain]
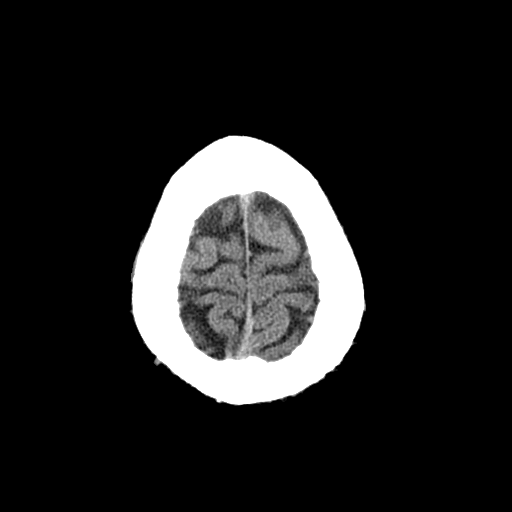
[im 28/32  brain]
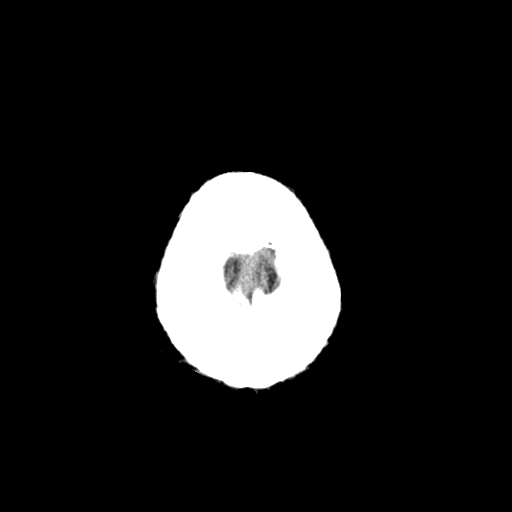
[im 30/32  brain]
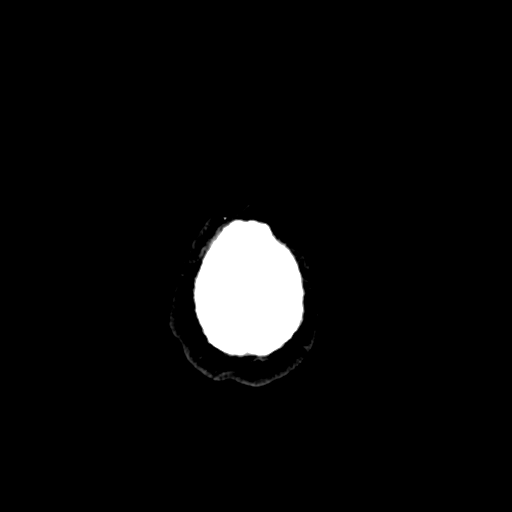

[16 of 30 positions shown; findings below may reference images not displayed]

FINDINGS: There is no evidence of acute infarction, mass lesion, or intra- or
extra-axial hemorrhage on CT.

The posterior fossa, including the cerebellum, brainstem and fourth
ventricle, is within normal limits. The third and lateral
ventricles, and basal ganglia are unremarkable in appearance. The
cerebral hemispheres are symmetric in appearance, with normal
gray-white differentiation. No mass effect or midline shift is seen.

There is no evidence of fracture; visualized osseous structures are
unremarkable in appearance. The orbits are within normal limits. The
paranasal sinuses and mastoid air cells are well-aerated. No
significant soft tissue abnormalities are seen.
IMPRESSION: No evidence of traumatic intracranial injury or fracture.
